# Patient Record
Sex: Female | Born: 1990 | Race: Black or African American | Hispanic: No | Marital: Single | Smoking: Never smoker
Health system: Southern US, Community
[De-identification: ages and names within clinical notes are randomized; demographics above are authoritative.]

---

## 2014-04-16 ENCOUNTER — Emergency Department (HOSPITAL_COMMUNITY)
Admission: EM | Admit: 2014-04-16 | Discharge: 2014-04-16 | Disposition: A | Discharge status: Home / Self Care | Payer: No Typology Code available for payment source | Attending: Emergency Medicine | Admitting: Emergency Medicine

## 2014-04-16 ENCOUNTER — Emergency Department (HOSPITAL_COMMUNITY): Payer: No Typology Code available for payment source

## 2014-04-16 ENCOUNTER — Encounter (HOSPITAL_COMMUNITY): Payer: Self-pay | Admitting: *Deleted

## 2014-04-16 DIAGNOSIS — Y92410 Unspecified street and highway as the place of occurrence of the external cause: Secondary | ICD-10-CM | POA: Diagnosis not present

## 2014-04-16 DIAGNOSIS — Y9389 Activity, other specified: Secondary | ICD-10-CM | POA: Diagnosis not present

## 2014-04-16 DIAGNOSIS — S161XXA Strain of muscle, fascia and tendon at neck level, initial encounter: Secondary | ICD-10-CM | POA: Insufficient documentation

## 2014-04-16 DIAGNOSIS — Y998 Other external cause status: Secondary | ICD-10-CM | POA: Diagnosis not present

## 2014-04-16 DIAGNOSIS — S29002A Unspecified injury of muscle and tendon of back wall of thorax, initial encounter: Secondary | ICD-10-CM | POA: Diagnosis present

## 2014-04-16 DIAGNOSIS — S39012A Strain of muscle, fascia and tendon of lower back, initial encounter: Secondary | ICD-10-CM | POA: Diagnosis not present

## 2014-04-16 MED ORDER — IBUPROFEN 600 MG PO TABS: 600.0000 mg | ORAL_TABLET | Freq: Four times a day (QID) | ORAL | Status: AC | PRN

## 2014-04-16 MED ORDER — CYCLOBENZAPRINE HCL 10 MG PO TABS: 10.0000 mg | ORAL_TABLET | Freq: Two times a day (BID) | ORAL | Status: AC | PRN

## 2014-04-16 NOTE — ED Notes (Signed)
Patient transported to X-ray 

## 2014-04-16 NOTE — Discharge Instructions (Signed)
Low Back Sprain with Rehab  A sprain is an injury in which a ligament is torn. The ligaments of the lower back are vulnerable to sprains. However, they are strong and require great force to be injured. These ligaments are important for stabilizing the spinal column. Sprains are classified into three categories. Grade 1 sprains cause pain, but the tendon is not lengthened. Grade 2 sprains include a lengthened ligament, due to the ligament being stretched or partially ruptured. With grade 2 sprains there is still function, although the function may be decreased. Grade 3 sprains involve a complete tear of the tendon or muscle, and function is usually impaired. SYMPTOMS   Severe pain in the lower back.  Sometimes, a feeling of a "pop," "snap," or tear, at the time of injury.  Tenderness and sometimes swelling at the injury site.  Uncommonly, bruising (contusion) within 48 hours of injury.  Muscle spasms in the back. CAUSES  Low back sprains occur when a force is placed on the ligaments that is greater than they can handle. Common causes of injury include:  Performing a stressful act while off-balance.  Repetitive stressful activities that involve movement of the lower back.  Direct hit (trauma) to the lower back. RISK INCREASES WITH:  Contact sports (football, wrestling).  Collisions (major skiing accidents).  Sports that require throwing or lifting (baseball, weightlifting).  Sports involving twisting of the spine (gymnastics, diving, tennis, golf).  Poor strength and flexibility.  Inadequate protection.  Previous back injury or surgery (especially fusion). PREVENTION  Wear properly fitted and padded protective equipment.  Warm up and stretch properly before activity.  Allow for adequate recovery between workouts.  Maintain physical fitness:  Strength, flexibility, and endurance.  Cardiovascular fitness.  Maintain a healthy body weight. PROGNOSIS  If treated  properly, low back sprains usually heal with non-surgical treatment. The length of time for healing depends on the severity of the injury.  RELATED COMPLICATIONS   Recurring symptoms, resulting in a chronic problem.  Chronic inflammation and pain in the low back.  Delayed healing or resolution of symptoms, especially if activity is resumed too soon.  Prolonged impairment.  Unstable or arthritic joints of the low back. TREATMENT  Treatment first involves the use of ice and medicine, to reduce pain and inflammation. The use of strengthening and stretching exercises may help reduce pain with activity. These exercises may be performed at home or with a therapist. Severe injuries may require referral to a therapist for further evaluation and treatment, such as ultrasound. Your caregiver may advise that you wear a back brace or corset, to help reduce pain and discomfort. Often, prolonged bed rest results in greater harm then benefit. Corticosteroid injections may be recommended. However, these should be reserved for the most serious cases. It is important to avoid using your back when lifting objects. At night, sleep on your back on a firm mattress, with a pillow placed under your knees. If non-surgical treatment is unsuccessful, surgery may be needed.  MEDICATION   If pain medicine is needed, nonsteroidal anti-inflammatory medicines (aspirin and ibuprofen), or other minor pain relievers (acetaminophen), are often advised.  Do not take pain medicine for 7 days before surgery.  Prescription pain relievers may be given, if your caregiver thinks they are needed. Use only as directed and only as much as you need.  Ointments applied to the skin may be helpful.  Corticosteroid injections may be given by your caregiver. These injections should be reserved for the most serious cases,   because they may only be given a certain number of times. HEAT AND COLD  Cold treatment (icing) should be applied for 10  to 15 minutes every 2 to 3 hours for inflammation and pain, and immediately after activity that aggravates your symptoms. Use ice packs or an ice massage.  Heat treatment may be used before performing stretching and strengthening activities prescribed by your caregiver, physical therapist, or athletic trainer. Use a heat pack or a warm water soak. SEEK MEDICAL CARE IF:   Symptoms get worse or do not improve in 2 to 4 weeks, despite treatment.  You develop numbness or weakness in either leg.  You lose bowel or bladder function.  Any of the following occur after surgery: fever, increased pain, swelling, redness, drainage of fluids, or bleeding in the affected area.  New, unexplained symptoms develop. (Drugs used in treatment may produce side effects.) EXERCISES  RANGE OF MOTION (ROM) AND STRETCHING EXERCISES - Low Back Sprain Most people with lower back pain will find that their symptoms get worse with excessive bending forward (flexion) or arching at the lower back (extension). The exercises that will help resolve your symptoms will focus on the opposite motion.  Your physician, physical therapist or athletic trainer will help you determine which exercises will be most helpful to resolve your lower back pain. Do not complete any exercises without first consulting with your caregiver. Discontinue any exercises which make your symptoms worse, until you speak to your caregiver. If you have pain, numbness or tingling which travels down into your buttocks, leg or foot, the goal of the therapy is for these symptoms to move closer to your back and eventually resolve. Sometimes, these leg symptoms will get better, but your lower back pain may worsen. This is often an indication of progress in your rehabilitation. Be very alert to any changes in your symptoms and the activities in which you participated in the 24 hours prior to the change. Sharing this information with your caregiver will allow him or her to  most efficiently treat your condition. These exercises may help you when beginning to rehabilitate your injury. Your symptoms may resolve with or without further involvement from your physician, physical therapist or athletic trainer. While completing these exercises, remember:   Restoring tissue flexibility helps normal motion to return to the joints. This allows healthier, less painful movement and activity.  An effective stretch should be held for at least 30 seconds.  A stretch should never be painful. You should only feel a gentle lengthening or release in the stretched tissue. FLEXION RANGE OF MOTION AND STRETCHING EXERCISES: STRETCH - Flexion, Single Knee to Chest   Lie on a firm bed or floor with both legs extended in front of you.  Keeping one leg in contact with the floor, bring your opposite knee to your chest. Hold your leg in place by either grabbing behind your thigh or at your knee.  Pull until you feel a gentle stretch in your low back. Hold __________ seconds.  Slowly release your grasp and repeat the exercise with the opposite side. Repeat __________ times. Complete this exercise __________ times per day.  STRETCH - Flexion, Double Knee to Chest  Lie on a firm bed or floor with both legs extended in front of you.  Keeping one leg in contact with the floor, bring your opposite knee to your chest.  Tense your stomach muscles to support your back and then lift your other knee to your chest. Hold your legs   in place by either grabbing behind your thighs or at your knees.  Pull both knees toward your chest until you feel a gentle stretch in your low back. Hold __________ seconds.  Tense your stomach muscles and slowly return one leg at a time to the floor. Repeat __________ times. Complete this exercise __________ times per day.  STRETCH - Low Trunk Rotation  Lie on a firm bed or floor. Keeping your legs in front of you, bend your knees so they are both pointed toward the  ceiling and your feet are flat on the floor.  Extend your arms out to the side. This will stabilize your upper body by keeping your shoulders in contact with the floor.  Gently and slowly drop both knees together to one side until you feel a gentle stretch in your low back. Hold for __________ seconds.  Tense your stomach muscles to support your lower back as you bring your knees back to the starting position. Repeat the exercise to the other side. Repeat __________ times. Complete this exercise __________ times per day  EXTENSION RANGE OF MOTION AND FLEXIBILITY EXERCISES: STRETCH - Extension, Prone on Elbows   Lie on your stomach on the floor, a bed will be too soft. Place your palms about shoulder width apart and at the height of your head.  Place your elbows under your shoulders. If this is too painful, stack pillows under your chest.  Allow your body to relax so that your hips drop lower and make contact more completely with the floor.  Hold this position for __________ seconds.  Slowly return to lying flat on the floor. Repeat __________ times. Complete this exercise __________ times per day.  RANGE OF MOTION - Extension, Prone Press Ups  Lie on your stomach on the floor, a bed will be too soft. Place your palms about shoulder width apart and at the height of your head.  Keeping your back as relaxed as possible, slowly straighten your elbows while keeping your hips on the floor. You may adjust the placement of your hands to maximize your comfort. As you gain motion, your hands will come more underneath your shoulders.  Hold this position __________ seconds.  Slowly return to lying flat on the floor. Repeat __________ times. Complete this exercise __________ times per day.  RANGE OF MOTION- Quadruped, Neutral Spine   Assume a hands and knees position on a firm surface. Keep your hands under your shoulders and your knees under your hips. You may place padding under your knees for  comfort.  Drop your head and point your tailbone toward the ground below you. This will round out your lower back like an angry cat. Hold this position for __________ seconds.  Slowly lift your head and release your tail bone so that your back sags into a large arch, like an old horse.  Hold this position for __________ seconds.  Repeat this until you feel limber in your low back.  Now, find your "sweet spot." This will be the most comfortable position somewhere between the two previous positions. This is your neutral spine. Once you have found this position, tense your stomach muscles to support your low back.  Hold this position for __________ seconds. Repeat __________ times. Complete this exercise __________ times per day.  STRENGTHENING EXERCISES - Low Back Sprain These exercises may help you when beginning to rehabilitate your injury. These exercises should be done near your "sweet spot." This is the neutral, low-back arch, somewhere between fully rounded   and fully arched, that is your least painful position. When performed in this safe range of motion, these exercises can be used for people who have either a flexion or extension based injury. These exercises may resolve your symptoms with or without further involvement from your physician, physical therapist or athletic trainer. While completing these exercises, remember:   Muscles can gain both the endurance and the strength needed for everyday activities through controlled exercises.  Complete these exercises as instructed by your physician, physical therapist or athletic trainer. Increase the resistance and repetitions only as guided.  You may experience muscle soreness or fatigue, but the pain or discomfort you are trying to eliminate should never worsen during these exercises. If this pain does worsen, stop and make certain you are following the directions exactly. If the pain is still present after adjustments, discontinue the  exercise until you can discuss the trouble with your caregiver. STRENGTHENING - Deep Abdominals, Pelvic Tilt   Lie on a firm bed or floor. Keeping your legs in front of you, bend your knees so they are both pointed toward the ceiling and your feet are flat on the floor.  Tense your lower abdominal muscles to press your low back into the floor. This motion will rotate your pelvis so that your tail bone is scooping upwards rather than pointing at your feet or into the floor. With a gentle tension and even breathing, hold this position for __________ seconds. Repeat __________ times. Complete this exercise __________ times per day.  STRENGTHENING - Abdominals, Crunches   Lie on a firm bed or floor. Keeping your legs in front of you, bend your knees so they are both pointed toward the ceiling and your feet are flat on the floor. Cross your arms over your chest.  Slightly tip your chin down without bending your neck.  Tense your abdominals and slowly lift your trunk high enough to just clear your shoulder blades. Lifting higher can put excessive stress on the lower back and does not further strengthen your abdominal muscles.  Control your return to the starting position. Repeat __________ times. Complete this exercise __________ times per day.  STRENGTHENING - Quadruped, Opposite UE/LE Lift   Assume a hands and knees position on a firm surface. Keep your hands under your shoulders and your knees under your hips. You may place padding under your knees for comfort.  Find your neutral spine and gently tense your abdominal muscles so that you can maintain this position. Your shoulders and hips should form a rectangle that is parallel with the floor and is not twisted.  Keeping your trunk steady, lift your right hand no higher than your shoulder and then your left leg no higher than your hip. Make sure you are not holding your breath. Hold this position for __________ seconds.  Continuing to keep  your abdominal muscles tense and your back steady, slowly return to your starting position. Repeat with the opposite arm and leg. Repeat __________ times. Complete this exercise __________ times per day.  STRENGTHENING - Abdominals and Quadriceps, Straight Leg Raise   Lie on a firm bed or floor with both legs extended in front of you.  Keeping one leg in contact with the floor, bend the other knee so that your foot can rest flat on the floor.  Find your neutral spine, and tense your abdominal muscles to maintain your spinal position throughout the exercise.  Slowly lift your straight leg off the floor about 6 inches for a count   of 15, making sure to not hold your breath.  Still keeping your neutral spine, slowly lower your leg all the way to the floor. Repeat this exercise with each leg __________ times. Complete this exercise __________ times per day. POSTURE AND BODY MECHANICS CONSIDERATIONS - Low Back Sprain Keeping correct posture when sitting, standing or completing your activities will reduce the stress put on different body tissues, allowing injured tissues a chance to heal and limiting painful experiences. The following are general guidelines for improved posture. Your physician or physical therapist will provide you with any instructions specific to your needs. While reading these guidelines, remember:  The exercises prescribed by your provider will help you have the flexibility and strength to maintain correct postures.  The correct posture provides the best environment for your joints to work. All of your joints have less wear and tear when properly supported by a spine with good posture. This means you will experience a healthier, less painful body.  Correct posture must be practiced with all of your activities, especially prolonged sitting and standing. Correct posture is as important when doing repetitive low-stress activities (typing) as it is when doing a single heavy-load  activity (lifting). RESTING POSITIONS Consider which positions are most painful for you when choosing a resting position. If you have pain with flexion-based activities (sitting, bending, stooping, squatting), choose a position that allows you to rest in a less flexed posture. You would want to avoid curling into a fetal position on your side. If your pain worsens with extension-based activities (prolonged standing, working overhead), avoid resting in an extended position such as sleeping on your stomach. Most people will find more comfort when they rest with their spine in a more neutral position, neither too rounded nor too arched. Lying on a non-sagging bed on your side with a pillow between your knees, or on your back with a pillow under your knees will often provide some relief. Keep in mind, being in any one position for a prolonged period of time, no matter how correct your posture, can still lead to stiffness. PROPER SITTING POSTURE In order to minimize stress and discomfort on your spine, you must sit with correct posture. Sitting with good posture should be effortless for a healthy body. Returning to good posture is a gradual process. Many people can work toward this most comfortably by using various supports until they have the flexibility and strength to maintain this posture on their own. When sitting with proper posture, your ears will fall over your shoulders and your shoulders will fall over your hips. You should use the back of the chair to support your upper back. Your lower back will be in a neutral position, just slightly arched. You may place a small pillow or folded towel at the base of your lower back for  support.  When working at a desk, create an environment that supports good, upright posture. Without extra support, muscles tire, which leads to excessive strain on joints and other tissues. Keep these recommendations in mind: CHAIR:  A chair should be able to slide under your desk  when your back makes contact with the back of the chair. This allows you to work closely.  The chair's height should allow your eyes to be level with the upper part of your monitor and your hands to be slightly lower than your elbows. BODY POSITION  Your feet should make contact with the floor. If this is not possible, use a foot rest.  Keep your   ears over your shoulders. This will reduce stress on your neck and low back. INCORRECT SITTING POSTURES  If you are feeling tired and unable to assume a healthy sitting posture, do not slouch or slump. This puts excessive strain on your back tissues, causing more damage and pain. Healthier options include:  Using more support, like a lumbar pillow.  Switching tasks to something that requires you to be upright or walking.  Talking a brief walk.  Lying down to rest in a neutral-spine position. PROLONGED STANDING WHILE SLIGHTLY LEANING FORWARD  When completing a task that requires you to lean forward while standing in one place for a long time, place either foot up on a stationary 2-4 inch high object to help maintain the best posture. When both feet are on the ground, the lower back tends to lose its slight inward curve. If this curve flattens (or becomes too large), then the back and your other joints will experience too much stress, tire more quickly, and can cause pain. CORRECT STANDING POSTURES Proper standing posture should be assumed with all daily activities, even if they only take a few moments, like when brushing your teeth. As in sitting, your ears should fall over your shoulders and your shoulders should fall over your hips. You should keep a slight tension in your abdominal muscles to brace your spine. Your tailbone should point down to the ground, not behind your body, resulting in an over-extended swayback posture.  INCORRECT STANDING POSTURES  Common incorrect standing postures include a forward head, locked knees and/or an excessive  swayback. WALKING Walk with an upright posture. Your ears, shoulders and hips should all line-up. PROLONGED ACTIVITY IN A FLEXED POSITION When completing a task that requires you to bend forward at your waist or lean over a low surface, try to find a way to stabilize 3 out of 4 of your limbs. You can place a hand or elbow on your thigh or rest a knee on the surface you are reaching across. This will provide you more stability, so that your muscles do not tire as quickly. By keeping your knees relaxed, or slightly bent, you will also reduce stress across your lower back. CORRECT LIFTING TECHNIQUES DO :  Assume a wide stance. This will provide you more stability and the opportunity to get as close as possible to the object which you are lifting.  Tense your abdominals to brace your spine. Bend at the knees and hips. Keeping your back locked in a neutral-spine position, lift using your leg muscles. Lift with your legs, keeping your back straight.  Test the weight of unknown objects before attempting to lift them.  Try to keep your elbows locked down at your sides in order get the best strength from your shoulders when carrying an object.  Always ask for help when lifting heavy or awkward objects. INCORRECT LIFTING TECHNIQUES DO NOT:   Lock your knees when lifting, even if it is a small object.  Bend and twist. Pivot at your feet or move your feet when needing to change directions.  Assume that you can safely pick up even a paperclip without proper posture. Document Released: 03/04/2005 Document Revised: 05/27/2011 Document Reviewed: 06/16/2008 [facility] Patient Information 2015 [place], [place]. This information is not intended to replace advice given to you by your health care provider. Make sure you discuss any questions you have with your health care provider. Cervical Sprain A cervical sprain is an injury in the neck in which the strong, fibrous  tissues (ligaments) that connect your neck  bones stretch or tear. Cervical sprains can range from mild to severe. Severe cervical sprains can cause the neck vertebrae to be unstable. This can lead to damage of the spinal cord and can result in serious nervous system problems. The amount of time it takes for a cervical sprain to get better depends on the cause and extent of the injury. Most cervical sprains heal in 1 to 3 weeks. CAUSES  Severe cervical sprains may be caused by:   Contact sport injuries (such as from football, rugby, wrestling, hockey, auto racing, gymnastics, diving, martial arts, or boxing).   Motor vehicle collisions.   Whiplash injuries. This is an injury from a sudden forward and backward whipping movement of the head and neck.  Falls.  Mild cervical sprains may be caused by:   Being in an awkward position, such as while cradling a telephone between your ear and shoulder.   Sitting in a chair that does not offer proper support.   Working at a poorly Marketing executive station.   Looking up or down for long periods of time.  SYMPTOMS   Pain, soreness, stiffness, or a burning sensation in the front, back, or sides of the neck. This discomfort may develop immediately after the injury or slowly, 24 hours or more after the injury.   Pain or tenderness directly in the middle of the back of the neck.   Shoulder or upper back pain.   Limited ability to move the neck.   Headache.   Dizziness.   Weakness, numbness, or tingling in the hands or arms.   Muscle spasms.   Difficulty swallowing or chewing.   Tenderness and swelling of the neck.  DIAGNOSIS  Most of the time your health care provider can diagnose a cervical sprain by taking your history and doing a physical exam. Your health care provider will ask about previous neck injuries and any known neck problems, such as arthritis in the neck. X-rays may be taken to find out if there are any other problems, such as with the bones of the  neck. Other tests, such as a CT scan or MRI, may also be needed.  TREATMENT  Treatment depends on the severity of the cervical sprain. Mild sprains can be treated with rest, keeping the neck in place (immobilization), and pain medicines. Severe cervical sprains are immediately immobilized. Further treatment is done to help with pain, muscle spasms, and other symptoms and may include:  Medicines, such as pain relievers, numbing medicines, or muscle relaxants.   Physical therapy. This may involve stretching exercises, strengthening exercises, and posture training. Exercises and improved posture can help stabilize the neck, strengthen muscles, and help stop symptoms from returning.  HOME CARE INSTRUCTIONS   Put ice on the injured area.   Put ice in a plastic bag.   Place a towel between your skin and the bag.   Leave the ice on for 15-20 minutes, 3-4 times a day.   If your injury was severe, you may have been given a cervical collar to wear. A cervical collar is a two-piece collar designed to keep your neck from moving while it heals.  Do not remove the collar unless instructed by your health care provider.  If you have long hair, keep it outside of the collar.  Ask your health care provider before making any adjustments to your collar. Minor adjustments may be required over time to improve comfort and reduce pressure on your chin  or on the back of your head.  Ifyou are allowed to remove the collar for cleaning or bathing, follow your health care provider's instructions on how to do so safely.  Keep your collar clean by wiping it with mild soap and water and drying it completely. If the collar you have been given includes removable pads, remove them every 1-2 days and hand wash them with soap and water. Allow them to air dry. They should be completely dry before you wear them in the collar.  If you are allowed to remove the collar for cleaning and bathing, wash and dry the skin of  your neck. Check your skin for irritation or sores. If you see any, tell your health care provider.  Do not drive while wearing the collar.   Only take over-the-counter or prescription medicines for pain, discomfort, or fever as directed by your health care provider.   Keep all follow-up appointments as directed by your health care provider.   Keep all physical therapy appointments as directed by your health care provider.   Make any needed adjustments to your workstation to promote good posture.   Avoid positions and activities that make your symptoms worse.   Warm up and stretch before being active to help prevent problems.  SEEK MEDICAL CARE IF:   Your pain is not controlled with medicine.   You are unable to decrease your pain medicine over time as planned.   Your activity level is not improving as expected.  SEEK IMMEDIATE MEDICAL CARE IF:   You develop any bleeding.  You develop stomach upset.  You have signs of an allergic reaction to your medicine.   Your symptoms get worse.   You develop new, unexplained symptoms.   You have numbness, tingling, weakness, or paralysis in any part of your body.  MAKE SURE YOU:   Understand these instructions.  Will watch your condition.  Will get help right away if you are not doing well or get worse. Document Released: 12/30/2006 Document Revised: 03/09/2013 Document Reviewed: 09/09/2012 [facility] Patient Information 2015 [employer], [place]. This information is not intended to replace advice given to you by your health care provider. Make sure you discuss any questions you have with your health care provider.
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
